# Patient Record
Sex: Female | Born: 1955 | Race: Black or African American | Hispanic: No | Marital: Single | Smoking: Current every day smoker
Health system: Southern US, Community
[De-identification: ages and names within clinical notes are randomized; demographics above are authoritative.]

## PROBLEM LIST (undated history)

## (undated) DIAGNOSIS — E119 Type 2 diabetes mellitus without complications: Secondary | ICD-10-CM

## (undated) DIAGNOSIS — M5430 Sciatica, unspecified side: Secondary | ICD-10-CM

## (undated) DIAGNOSIS — J45909 Unspecified asthma, uncomplicated: Secondary | ICD-10-CM

---

## 2004-05-28 ENCOUNTER — Emergency Department (HOSPITAL_COMMUNITY): Admission: EM | Admit: 2004-05-28 | Discharge: 2004-05-28 | Payer: Self-pay | Admitting: Emergency Medicine

## 2008-05-18 ENCOUNTER — Emergency Department (HOSPITAL_COMMUNITY): Admission: EM | Admit: 2008-05-18 | Discharge: 2008-05-18 | Payer: Self-pay | Admitting: Emergency Medicine

## 2017-06-11 ENCOUNTER — Other Ambulatory Visit: Payer: Self-pay

## 2017-06-11 ENCOUNTER — Emergency Department (HOSPITAL_COMMUNITY)
Admission: EM | Admit: 2017-06-11 | Discharge: 2017-06-11 | Disposition: A | Discharge status: Home / Self Care | Payer: Medicaid Other | Attending: Emergency Medicine | Admitting: Emergency Medicine

## 2017-06-11 ENCOUNTER — Encounter (HOSPITAL_COMMUNITY): Payer: Self-pay | Admitting: Emergency Medicine

## 2017-06-11 DIAGNOSIS — Z5321 Procedure and treatment not carried out due to patient leaving prior to being seen by health care provider: Secondary | ICD-10-CM | POA: Insufficient documentation

## 2017-06-11 DIAGNOSIS — R079 Chest pain, unspecified: Secondary | ICD-10-CM | POA: Insufficient documentation

## 2017-06-11 DIAGNOSIS — R109 Unspecified abdominal pain: Secondary | ICD-10-CM | POA: Insufficient documentation

## 2017-06-11 HISTORY — DX: Type 2 diabetes mellitus without complications: E11.9

## 2017-06-11 LAB — URINALYSIS, ROUTINE W REFLEX MICROSCOPIC
BILIRUBIN URINE: NEGATIVE
Glucose, UA: NEGATIVE mg/dL
Hgb urine dipstick: NEGATIVE
Ketones, ur: NEGATIVE mg/dL
Nitrite: NEGATIVE
Protein, ur: NEGATIVE mg/dL
SPECIFIC GRAVITY, URINE: 1.01 (ref 1.005–1.030)
pH: 6 (ref 5.0–8.0)

## 2017-06-11 LAB — URINALYSIS, MICROSCOPIC (REFLEX): RBC / HPF: NONE SEEN RBC/hpf (ref 0–5)

## 2017-06-11 LAB — CBC
HEMATOCRIT: 43.5 % (ref 36.0–46.0)
HEMOGLOBIN: 13.7 g/dL (ref 12.0–15.0)
MCH: 28.8 pg (ref 26.0–34.0)
MCHC: 31.5 g/dL (ref 30.0–36.0)
MCV: 91.4 fL (ref 78.0–100.0)
Platelets: 274 10*3/uL (ref 150–400)
RBC: 4.76 MIL/uL (ref 3.87–5.11)
RDW: 12.7 % (ref 11.5–15.5)
WBC: 10 10*3/uL (ref 4.0–10.5)

## 2017-06-11 LAB — LIPASE, BLOOD: Lipase: 29 U/L (ref 11–51)

## 2017-06-11 LAB — COMPREHENSIVE METABOLIC PANEL
ALBUMIN: 3.7 g/dL (ref 3.5–5.0)
ALT: 41 U/L (ref 14–54)
AST: 25 U/L (ref 15–41)
Alkaline Phosphatase: 93 U/L (ref 38–126)
Anion gap: 11 (ref 5–15)
BILIRUBIN TOTAL: 0.4 mg/dL (ref 0.3–1.2)
BUN: 12 mg/dL (ref 6–20)
CALCIUM: 9.8 mg/dL (ref 8.9–10.3)
CO2: 27 mmol/L (ref 22–32)
Chloride: 103 mmol/L (ref 101–111)
Creatinine, Ser: 0.82 mg/dL (ref 0.44–1.00)
GFR calc Af Amer: >60 mL/min (ref >60)
GFR calc non Af Amer: >60 mL/min (ref >60)
GLUCOSE: 122 mg/dL — AB (ref 65–99)
POTASSIUM: 4.1 mmol/L (ref 3.5–5.1)
SODIUM: 141 mmol/L (ref 135–145)
TOTAL PROTEIN: 7.7 g/dL (ref 6.5–8.1)

## 2017-06-11 NOTE — ED Notes (Signed)
Called multiple times by multiple RNs for room.   No response in waiting room.

## 2017-06-11 NOTE — ED Triage Notes (Signed)
Pt to ER for 3 days epigastric abd pain, generalized tenderness in all quadrants. Also reports chest pain and back pain x3 days. Also reports back, leg, shoulder, neck, and head pain. Pt poor historian. Reports nausea without vomiting and frequent bowel movements, "sometimes normal, sometimes not." pt a/o x4. Lung sounds clear.

## 2017-06-12 ENCOUNTER — Emergency Department (HOSPITAL_COMMUNITY)
Admission: EM | Admit: 2017-06-12 | Discharge: 2017-06-12 | Discharge status: Against Medical Advice | Payer: Medicaid Other | Attending: Emergency Medicine | Admitting: Emergency Medicine

## 2017-06-12 DIAGNOSIS — Z5321 Procedure and treatment not carried out due to patient leaving prior to being seen by health care provider: Secondary | ICD-10-CM | POA: Insufficient documentation

## 2017-06-12 DIAGNOSIS — R197 Diarrhea, unspecified: Secondary | ICD-10-CM | POA: Diagnosis present

## 2017-06-12 NOTE — ED Notes (Signed)
PT told EMT 1st that she is leaving due to wait time.,

## 2017-06-12 NOTE — ED Notes (Addendum)
Pt BIB [facility] c/o nausea and diarrhea intermittently x5 days. Also reports lowers central abdominal pain. VSS.

## 2017-08-04 ENCOUNTER — Telehealth: Payer: Self-pay | Admitting: Physician Assistant

## 2017-08-04 NOTE — Telephone Encounter (Signed)
Pt needs to make an appt for establishing care and to follow up on her psych meds per Collie SiadAngela Johnson FNP. From [facility]. Please call pt to schedule. [phone]

## 2017-08-04 NOTE — Telephone Encounter (Signed)
Not out patient and has medicaid which we are not contracted with.

## 2017-08-07 ENCOUNTER — Encounter (HOSPITAL_COMMUNITY): Payer: Self-pay

## 2017-08-07 ENCOUNTER — Other Ambulatory Visit: Payer: Self-pay

## 2017-08-07 ENCOUNTER — Emergency Department (HOSPITAL_COMMUNITY)
Admission: EM | Admit: 2017-08-07 | Discharge: 2017-08-07 | Disposition: A | Discharge status: Home / Self Care | Payer: Medicaid Other | Attending: Emergency Medicine | Admitting: Emergency Medicine

## 2017-08-07 ENCOUNTER — Emergency Department (HOSPITAL_COMMUNITY): Payer: Medicaid Other

## 2017-08-07 DIAGNOSIS — F17210 Nicotine dependence, cigarettes, uncomplicated: Secondary | ICD-10-CM | POA: Insufficient documentation

## 2017-08-07 DIAGNOSIS — Z79899 Other long term (current) drug therapy: Secondary | ICD-10-CM | POA: Diagnosis not present

## 2017-08-07 DIAGNOSIS — R0602 Shortness of breath: Secondary | ICD-10-CM | POA: Diagnosis not present

## 2017-08-07 DIAGNOSIS — M545 Low back pain, unspecified: Secondary | ICD-10-CM

## 2017-08-07 DIAGNOSIS — M25561 Pain in right knee: Secondary | ICD-10-CM | POA: Diagnosis not present

## 2017-08-07 DIAGNOSIS — J45909 Unspecified asthma, uncomplicated: Secondary | ICD-10-CM | POA: Diagnosis not present

## 2017-08-07 DIAGNOSIS — G8929 Other chronic pain: Secondary | ICD-10-CM | POA: Diagnosis not present

## 2017-08-07 DIAGNOSIS — M25562 Pain in left knee: Secondary | ICD-10-CM | POA: Diagnosis not present

## 2017-08-07 DIAGNOSIS — E119 Type 2 diabetes mellitus without complications: Secondary | ICD-10-CM | POA: Insufficient documentation

## 2017-08-07 HISTORY — DX: Unspecified asthma, uncomplicated: J45.909

## 2017-08-07 HISTORY — DX: Sciatica, unspecified side: M54.30

## 2017-08-07 LAB — CBC WITH DIFFERENTIAL/PLATELET
Abs Immature Granulocytes: 0 10*3/uL (ref 0.0–0.1)
BASOS PCT: 1 %
Basophils Absolute: 0.1 10*3/uL (ref 0.0–0.1)
EOS ABS: 0.2 10*3/uL (ref 0.0–0.7)
EOS PCT: 2 %
HCT: 42.8 % (ref 36.0–46.0)
Hemoglobin: 13.1 g/dL (ref 12.0–15.0)
IMMATURE GRANULOCYTES: 0 %
Lymphocytes Relative: 33 %
Lymphs Abs: 3.1 10*3/uL (ref 0.7–4.0)
MCH: 28.8 pg (ref 26.0–34.0)
MCHC: 30.6 g/dL (ref 30.0–36.0)
MCV: 94.1 fL (ref 78.0–100.0)
Monocytes Absolute: 0.8 10*3/uL (ref 0.1–1.0)
Monocytes Relative: 9 %
NEUTROS PCT: 55 %
Neutro Abs: 5.3 10*3/uL (ref 1.7–7.7)
PLATELETS: 239 10*3/uL (ref 150–400)
RBC: 4.55 MIL/uL (ref 3.87–5.11)
RDW: 13.4 % (ref 11.5–15.5)
WBC: 9.6 10*3/uL (ref 4.0–10.5)

## 2017-08-07 LAB — COMPREHENSIVE METABOLIC PANEL
ALK PHOS: 82 U/L (ref 38–126)
ALT: 24 U/L (ref 0–44)
AST: 20 U/L (ref 15–41)
Albumin: 3.5 g/dL (ref 3.5–5.0)
Anion gap: 8 (ref 5–15)
BUN: 17 mg/dL (ref 8–23)
CALCIUM: 9.2 mg/dL (ref 8.9–10.3)
CO2: 24 mmol/L (ref 22–32)
CREATININE: 1.23 mg/dL — AB (ref 0.44–1.00)
Chloride: 113 mmol/L — ABNORMAL HIGH (ref 98–111)
GFR, EST AFRICAN AMERICAN: 53 mL/min — AB (ref >60)
GFR, EST NON AFRICAN AMERICAN: 46 mL/min — AB (ref >60)
Glucose, Bld: 118 mg/dL — ABNORMAL HIGH (ref 70–99)
Potassium: 4 mmol/L (ref 3.5–5.1)
Sodium: 145 mmol/L (ref 135–145)
Total Bilirubin: 0.6 mg/dL (ref 0.3–1.2)
Total Protein: 6.6 g/dL (ref 6.5–8.1)

## 2017-08-07 MED ORDER — TRAMADOL HCL 50 MG PO TABS: 50.0000 mg | ORAL_TABLET | Freq: Four times a day (QID) | ORAL | 0 refills | Status: AC | PRN

## 2017-08-07 NOTE — Discharge Instructions (Addendum)
Please return for any problem.  Follow-up with your regular doctor as instructed. °

## 2017-08-07 NOTE — ED Triage Notes (Signed)
Pt brought in by [facility] from home for SOB and worsening sciatica pain. Pt has hx of asthma, did not take home meds this am. Pt given x2 duoneb PTA. Pt discharged from [facility] yesterday after being seen for hypertension. Pt A+Ox4 and in NAD on arrival.

## 2017-08-07 NOTE — ED Notes (Signed)
Patient transported to X-ray 

## 2017-08-07 NOTE — ED Notes (Signed)
Patient verbalizes understanding of discharge instructions. Opportunity for questioning and answers were provided. Pt given bus pass and discharged from ED.

## 2017-08-07 NOTE — ED Provider Notes (Signed)
[facility] EMERGENCY DEPARTMENT Provider Note   CSN: [id number] Arrival date & time: 08/07/17  0841     History   Chief Complaint Chief Complaint  Patient presents with  . Shortness of Breath    HPI Debra Hull is a 62 y.o. female.  62 year old female with prior medical history as detailed below presents with complaint of low back pain.  Patient reports long-standing history of low back pain.  Patient reports that her gabapentin that she takes regularly is not adequate for pain control.  She denies any specific injury.  She denies recent fever.  She denies focal weakness.  She was transported here by EMS.  EMS noticed some faint wheezes on their initial evaluation and gave the patient 1 DuoNeb treatment.  Her wheezing is resolved by time of arrival to the ED.  She is without complaint of chest pain or shortness of breath.  The history is provided by the patient.  Back Pain   This is a chronic problem. The current episode started more than 1 week ago. The problem occurs daily. The problem has not changed since onset.The pain is associated with no known injury. The pain is present in the lumbar spine. The quality of the pain is described as aching. The pain does not radiate. The pain is mild. The symptoms are aggravated by bending and twisting. The pain is worse during the night. Pertinent negatives include no chest pain, no fever, no abdominal pain, no bowel incontinence, no perianal numbness, no bladder incontinence, no dysuria, no paresthesias, no paresis, no tingling and no weakness.    Past Medical History:  Diagnosis Date  . Asthma   . Diabetes mellitus without complication (HCC)   . Sciatica     There are no active problems to display for this patient.   History reviewed. No pertinent surgical history.   OB History   None      Home Medications    Prior to Admission medications   Medication Sig Start Date End Date Taking? Authorizing Provider   albuterol (PROVENTIL HFA;VENTOLIN HFA) 108 (90 Base) MCG/ACT inhaler Inhale 1 puff into the lungs every 6 (six) hours as needed for wheezing or shortness of breath.    [provider]  ATORVASTATIN CALCIUM PO Take 1 tablet by mouth daily.    [provider]  CARVEDILOL PO Take 1 tablet by mouth 2 (two) times daily.    [provider]  Fluticasone-Salmeterol (ADVAIR DISKUS IN) Inhale into the lungs.    [provider]  GABAPENTIN PO Take 2 tablets by mouth 3 (three) times daily.    [provider]  HYDROcodone-Acetaminophen (VICODIN PO) Take 1 tablet by mouth as needed (pain).    [provider]  METFORMIN HCL PO Take 1 tablet by mouth 2 (two) times daily.    [provider]  traMADol (ULTRAM) 50 MG tablet Take 1 tablet (50 mg total) by mouth every 6 (six) hours as needed. 08/07/17   Wynetta Fines, MD    Family History No family history on file.  Social History Social History   Tobacco Use  . Smoking status: Current Every Day Smoker    Packs/day: 1.00    Types: Cigarettes  . Smokeless tobacco: Never Used  Substance Use Topics  . Alcohol use: Not on file  . Drug use: Not on file     Allergies   Patient has no known allergies.   Review of Systems Review of Systems  Constitutional: Negative for fever.  Cardiovascular: Negative for chest pain.  Gastrointestinal: Negative for abdominal pain and bowel incontinence.  Genitourinary: Negative for bladder incontinence and dysuria.  Musculoskeletal: Positive for back pain.  Neurological: Negative for tingling, weakness and paresthesias.  All other systems reviewed and are negative.    Physical Exam Updated Vital Signs BP 105/71   Pulse 78   Resp 16   Ht 4\' 11"  (1.499 m)   Wt 72.6 kg (160 lb)   SpO2 96%   BMI 32.32 kg/m   Physical Exam  Constitutional: She is oriented to person, place, and time. She appears well-developed and well-nourished. No distress.    HENT:  Head: Normocephalic and atraumatic.  Mouth/Throat: Oropharynx is clear and moist.  Eyes: Pupils are equal, round, and reactive to light. Conjunctivae and EOM are normal.  Neck: Normal range of motion. Neck supple.  Cardiovascular: Normal rate, regular rhythm and normal heart sounds.  Pulmonary/Chest: Effort normal and breath sounds normal. No respiratory distress.  Abdominal: Soft. She exhibits no distension. There is no tenderness.  Musculoskeletal: Normal range of motion. She exhibits no edema or deformity.  Neurological: She is alert and oriented to person, place, and time.  Skin: Skin is warm and dry.  Psychiatric: She has a normal mood and affect.  Nursing note and vitals reviewed.    ED Treatments / Results  Labs (all labs ordered are listed, but only abnormal results are displayed) Labs Reviewed  COMPREHENSIVE METABOLIC PANEL - Abnormal; Notable for the following components:      Result Value   Chloride 113 (*)    Glucose, Bld 118 (*)    Creatinine, Ser 1.23 (*)    GFR calc non Af Amer 46 (*)    GFR calc Af Amer 53 (*)    All other components within normal limits  CBC WITH DIFFERENTIAL/PLATELET    EKG EKG Interpretation  Date/Time:  Friday August 07 2017 09:31:28 EDT Ventricular Rate:  90 PR Interval:    QRS Duration: 86 QT Interval:  399 QTC Calculation: 489 R Axis:   77 Text Interpretation:  Sinus rhythm Borderline prolonged QT interval Confirmed by Kristine RoyalMessick, Peter [phone]([id number]) on 08/07/2017 9:34:30 AM   Radiology Dg Chest 2 View  Result Date: 08/07/2017 CLINICAL DATA:  Shortness of breath today. EXAM: CHEST - 2 VIEW COMPARISON:  Chest x-ray dated 05/18/2008. FINDINGS: The heart size and mediastinal contours are within normal limits. Both lungs are clear. The visualized skeletal structures are unremarkable. IMPRESSION: No active cardiopulmonary disease. No evidence of pneumonia or pulmonary edema. Electronically Signed   By: Bary RichardStan  Maynard M.D.   On: 08/07/2017  09:25   Dg Lumbar Spine Complete  Result Date: 08/07/2017 CLINICAL DATA:  Chronic low back pain.  No known injury. EXAM: LUMBAR SPINE - COMPLETE 4+ VIEW COMPARISON:  None. FINDINGS: There is no evidence of lumbar spine fracture. Alignment is normal. Intervertebral disc spaces are maintained. Mild degenerative facet hypertrophy in the lower lumbar spine. Cholecystectomy clips in the RIGHT upper quadrant. Atherosclerotic changes of the infrarenal abdominal aorta. Visualized paravertebral soft tissues are otherwise unremarkable. IMPRESSION: 1. No acute findings. 2. Mild degenerative facet hypertrophy in the lower lumbar spine. Disc spaces are well maintained throughout the lumbar spine. 3. Aortic atherosclerosis. Electronically Signed   By: Bary RichardStan  Maynard M.D.   On: 08/07/2017 09:27   Dg Knee 2 Views Left  Result Date: 08/07/2017 CLINICAL DATA:  Bilateral knee pain for weeks.  No injury. EXAM: LEFT KNEE - 1-2 VIEW  COMPARISON:  None. FINDINGS: No evidence of fracture, dislocation, or joint effusion. No evidence of arthropathy or other focal bone abnormality. Soft tissues are unremarkable. IMPRESSION: Negative. Electronically Signed   By: Bary Richard M.D.   On: 08/07/2017 09:28   Dg Knee 2 Views Right  Result Date: 08/07/2017 CLINICAL DATA:  Bilateral knee pain for weeks.  No known injury. EXAM: RIGHT KNEE - 1-2 VIEW COMPARISON:  None. FINDINGS: No evidence of fracture, dislocation, or joint effusion. No evidence of arthropathy or other focal bone abnormality. Soft tissues are unremarkable. IMPRESSION: Negative. Electronically Signed   By: Bary Richard M.D.   On: 08/07/2017 09:28    Procedures Procedures (including critical care time)  Medications Ordered in ED Medications - No data to display   Initial Impression / Assessment and Plan / ED Course  I have reviewed the triage vital signs and the nursing notes.  Pertinent labs & imaging results that were available during my care of the patient  were reviewed by me and considered in my medical decision making (see chart for details).     MDM  Screen complete  Patient is presenting primarily for evaluation of low back pain.  This appears to be a chronic issue.  Exam does not suggest other acute pathology.  Screening films are without significant pathology.  Screening labs are also without significant abnormality.  Patient feels improved following her ED evaluation.  She desires discharge home.  Importance of close follow-up is stressed.  Strict return precautions are given and understood.  Final Clinical Impressions(s) / ED Diagnoses   Final diagnoses:  Chronic bilateral low back pain without sciatica    ED Discharge Orders        Ordered    traMADol (ULTRAM) 50 MG tablet  Every 6 hours PRN     08/07/17 1007       Wynetta Fines, MD 08/07/17 1011

## 2017-08-08 ENCOUNTER — Emergency Department (HOSPITAL_COMMUNITY)
Admission: EM | Admit: 2017-08-08 | Discharge: 2017-08-08 | Disposition: A | Discharge status: Home / Self Care | Payer: Medicaid Other | Attending: Emergency Medicine | Admitting: Emergency Medicine

## 2017-08-08 ENCOUNTER — Encounter (HOSPITAL_COMMUNITY): Payer: Self-pay

## 2017-08-08 DIAGNOSIS — E119 Type 2 diabetes mellitus without complications: Secondary | ICD-10-CM | POA: Diagnosis not present

## 2017-08-08 DIAGNOSIS — M545 Low back pain, unspecified: Secondary | ICD-10-CM

## 2017-08-08 DIAGNOSIS — G8929 Other chronic pain: Secondary | ICD-10-CM | POA: Diagnosis not present

## 2017-08-08 DIAGNOSIS — R202 Paresthesia of skin: Secondary | ICD-10-CM | POA: Insufficient documentation

## 2017-08-08 DIAGNOSIS — J45909 Unspecified asthma, uncomplicated: Secondary | ICD-10-CM | POA: Insufficient documentation

## 2017-08-08 DIAGNOSIS — Z7984 Long term (current) use of oral hypoglycemic drugs: Secondary | ICD-10-CM | POA: Insufficient documentation

## 2017-08-08 DIAGNOSIS — R062 Wheezing: Secondary | ICD-10-CM | POA: Insufficient documentation

## 2017-08-08 DIAGNOSIS — Z5320 Procedure and treatment not carried out because of patient's decision for unspecified reasons: Secondary | ICD-10-CM | POA: Diagnosis not present

## 2017-08-08 DIAGNOSIS — Z79899 Other long term (current) drug therapy: Secondary | ICD-10-CM | POA: Insufficient documentation

## 2017-08-08 DIAGNOSIS — F17210 Nicotine dependence, cigarettes, uncomplicated: Secondary | ICD-10-CM | POA: Diagnosis not present

## 2017-08-08 MED ORDER — ACETAMINOPHEN 500 MG PO TABS: 1000.0000 mg | ORAL_TABLET | Freq: Once | ORAL | Status: DC

## 2017-08-08 MED ORDER — PREDNISONE 20 MG PO TABS
60.0000 mg | ORAL_TABLET | Freq: Once | ORAL | Status: DC
Start: 2017-08-08 — End: 2017-08-08

## 2017-08-08 MED ORDER — LIDOCAINE 5 % EX PTCH: 1.0000 | MEDICATED_PATCH | CUTANEOUS | Status: DC

## 2017-08-08 NOTE — ED Provider Notes (Signed)
[facility] EMERGENCY DEPARTMENT Provider Note   CSN: [id number] Arrival date & time: 08/08/17  1247     History   Chief Complaint Chief Complaint  Patient presents with  . Shortness of Breath    HPI Debra Hull is a 62 y.o. female with history of asthma and sciatica is here for evaluation of "sciatica flare".  She reports 10/10 pain to her right low back that radiates into her right buttocks and shoots down her posterior right leg including toes.  Associated with intermittent tingling to her second right toe.  States she has a long history of sciatica and her pain today feels typical of her sciatica flares.  She denies any falls or recent trauma.  She denies any urinary symptoms, groin numbness, bladder or bowel incontinence or retention, loss of sensation or strength in her lower extremities, fevers, chills, abdominal pain.  She was seen in the ER yesterday for back pain, she was discharged with tramadol but states that "tramadol does not work".  She does not take Tylenol or ibuprofen because these medicines typically do not help with her sciatica pain.  She is now longer taking gabapentin because it does not help her sciatica pain.  I asked her about the shortness of breath documented on triage note, she states that she is not having any shortness of breath today.  Chart shows that she was noted to have some wheezing by EMS yesterday, that improved upon evaluation by EDP.  HPI  Past Medical History:  Diagnosis Date  . Asthma   . Diabetes mellitus without complication (HCC)   . Sciatica     There are no active problems to display for this patient.   History reviewed. No pertinent surgical history.   OB History   None      Home Medications    Prior to Admission medications   Medication Sig Start Date End Date Taking? Authorizing Provider  albuterol (PROVENTIL HFA;VENTOLIN HFA) 108 (90 Base) MCG/ACT inhaler Inhale 1 puff into the lungs every 6 (six)  hours as needed for wheezing or shortness of breath.    [provider]  ATORVASTATIN CALCIUM PO Take 1 tablet by mouth daily.    [provider]  CARVEDILOL PO Take 1 tablet by mouth 2 (two) times daily.    [provider]  Fluticasone-Salmeterol (ADVAIR DISKUS IN) Inhale into the lungs.    [provider]  GABAPENTIN PO Take 2 tablets by mouth 3 (three) times daily.    [provider]  HYDROcodone-Acetaminophen (VICODIN PO) Take 1 tablet by mouth as needed (pain).    [provider]  METFORMIN HCL PO Take 1 tablet by mouth 2 (two) times daily.    [provider]  traMADol (ULTRAM) 50 MG tablet Take 1 tablet (50 mg total) by mouth every 6 (six) hours as needed. 08/07/17   Wynetta Fines, MD    Family History History reviewed. No pertinent family history.  Social History Social History   Tobacco Use  . Smoking status: Current Every Day Smoker    Packs/day: 1.00    Types: Cigarettes  . Smokeless tobacco: Never Used  Substance Use Topics  . Alcohol use: Not on file  . Drug use: Not on file     Allergies   Patient has no known allergies.   Review of Systems Review of Systems  Musculoskeletal: Positive for back pain.  Neurological:       Tingling   All other  systems reviewed and are negative.    Physical Exam Updated Vital Signs Ht 4\' 11"  (1.499 m)   Wt 72.6 kg (160 lb)   SpO2 94%   BMI 32.32 kg/m   Physical Exam  Constitutional: She is oriented to person, place, and time. She appears well-developed and well-nourished. No distress.  HENT:  Head: Normocephalic and atraumatic.  Nose: Nose normal.  Eyes: EOM are normal.  Cardiovascular: Normal rate, S1 normal, S2 normal and normal heart sounds.  Pulses:      Radial pulses are 2+ on the right side, and 2+ on the left side.       Dorsalis pedis pulses are 2+ on the right side, and 2+ on the left side.  Pulmonary/Chest: Effort normal and breath sounds  normal. She has no decreased breath sounds.  Abdominal: Soft. Normal appearance and bowel sounds are normal. There is no tenderness.  No suprapubic or CVA tenderness   Musculoskeletal: She exhibits tenderness.       Right shoulder: She exhibits tenderness.       Lumbar back: She exhibits tenderness and pain.  C-spine: No midline tenderness  T-spine: No midline tenderness L-spine: No midline tenderness.  Mild right-sided SI and sciatic notch tenderness.  Full passive range of motion's of bilateral hips without pain.  Negative SLR bilaterally. Pelvis: No AP/lateral instability with compression.  No leg shortening or rotation.  Neurological: She is alert and oriented to person, place, and time.  5/5 strength with flexion/extension of hip, knee and ankle, bilaterally.  Sensation to light touch intact in lower extremities including feet  Skin: Skin is warm and dry. Capillary refill takes less than 2 seconds.  No overlying rash, ecchymosis or wounds to the lumbar spine or buttocks.  Psychiatric: She has a normal mood and affect. Her speech is normal and behavior is normal. Judgment and thought content normal. Cognition and memory are normal.  Nursing note and vitals reviewed.    ED Treatments / Results  Labs (all labs ordered are listed, but only abnormal results are displayed) Labs Reviewed - No data to display  EKG None  Radiology Dg Chest 2 View  Result Date: 08/07/2017 CLINICAL DATA:  Shortness of breath today. EXAM: CHEST - 2 VIEW COMPARISON:  Chest x-ray dated 05/18/2008. FINDINGS: The heart size and mediastinal contours are within normal limits. Both lungs are clear. The visualized skeletal structures are unremarkable. IMPRESSION: No active cardiopulmonary disease. No evidence of pneumonia or pulmonary edema. Electronically Signed   By: Bary Richard M.D.   On: 08/07/2017 09:25   Dg Lumbar Spine Complete  Result Date: 08/07/2017 CLINICAL DATA:  Chronic low back pain.  No known  injury. EXAM: LUMBAR SPINE - COMPLETE 4+ VIEW COMPARISON:  None. FINDINGS: There is no evidence of lumbar spine fracture. Alignment is normal. Intervertebral disc spaces are maintained. Mild degenerative facet hypertrophy in the lower lumbar spine. Cholecystectomy clips in the RIGHT upper quadrant. Atherosclerotic changes of the infrarenal abdominal aorta. Visualized paravertebral soft tissues are otherwise unremarkable. IMPRESSION: 1. No acute findings. 2. Mild degenerative facet hypertrophy in the lower lumbar spine. Disc spaces are well maintained throughout the lumbar spine. 3. Aortic atherosclerosis. Electronically Signed   By: Bary Richard M.D.   On: 08/07/2017 09:27   Dg Knee 2 Views Left  Result Date: 08/07/2017 CLINICAL DATA:  Bilateral knee pain for weeks.  No injury. EXAM: LEFT KNEE - 1-2 VIEW COMPARISON:  None. FINDINGS: No evidence of fracture, dislocation, or joint effusion. No evidence  of arthropathy or other focal bone abnormality. Soft tissues are unremarkable. IMPRESSION: Negative. Electronically Signed   By: Bary RichardStan  Maynard M.D.   On: 08/07/2017 09:28   Dg Knee 2 Views Right  Result Date: 08/07/2017 CLINICAL DATA:  Bilateral knee pain for weeks.  No known injury. EXAM: RIGHT KNEE - 1-2 VIEW COMPARISON:  None. FINDINGS: No evidence of fracture, dislocation, or joint effusion. No evidence of arthropathy or other focal bone abnormality. Soft tissues are unremarkable. IMPRESSION: Negative. Electronically Signed   By: Bary RichardStan  Maynard M.D.   On: 08/07/2017 09:28    Procedures Procedures (including critical care time)  Medications Ordered in ED Medications  lidocaine (LIDODERM) 5 % 1 patch (has no administration in time range)  acetaminophen (TYLENOL) tablet 1,000 mg (has no administration in time range)  predniSONE (DELTASONE) tablet 60 mg (has no administration in time range)     Initial Impression / Assessment and Plan / ED Course  I have reviewed the triage vital signs and the  nursing notes.  Pertinent labs & imaging results that were available during my care of the patient were reviewed by me and considered in my medical decision making (see chart for details).    Exam consistent with MSk back pain.  She has reproducible R SI and sciatic notch pain, negative SLR bilaterally. No overlaying rash. Abdominal exam benign, without pulsatility, suprapubic or CVA tenderness. Distal pulses symmetric bilaterally. No focal neurological deficits. Considered UTI/pyelo, kidney stone, cauda equina, epidural abscess, dissection considered but these don't fit clinical picture. Favoring strain vs spasm vs radiculopathy.  No red flag features of back pain present such as saddle anesthesia, bladder/bowel incontinence or retention, fevers, h/o cancer, IVDU, preceding trauma or falls, unilateral weakness, urinary symptoms. Emergent lab/imaging not thought to be indicated today as physical exam reassuring.  I reviewed lumbar x-rays obtained in ER yesterday which were unremarkable. No indication for emergent MRI. She can move and reposition herself on bed using leg without difficulty, only mild pain on back during movement.  Pt states ibuprofen, tylenol, tramadol, gabapentin do not help her pain.  Will offer lidoderm patch, toradol PO, prednisone for possible radiculopathy.  Pt was content with this plan and appreciative, states in the past patches have helped.  However, upon re-evaluation pt was no in room, no belonging found.   Final Clinical Impressions(s) / ED Diagnoses   Final diagnoses:  Chronic right-sided low back pain without sciatica    ED Discharge Orders    None       Jerrell MylarGibbons, Claudia J, PA-C 08/08/17 1622    Shaune PollackIsaacs, Cameron, MD 08/09/17 1213

## 2017-08-08 NOTE — ED Triage Notes (Signed)
Pt arrived via [facility] from [facility] c/o SOB.  Pt was seen yesterday for COPD exacerbation.  EMS gave 5mg  Albuterol.

## 2017-08-08 NOTE — ED Notes (Signed)
Pt not in hallway stretcher also checked bathroom, no personal belongings left on stretcher

## 2018-02-17 ENCOUNTER — Ambulatory Visit (HOSPITAL_COMMUNITY)
Admission: RE | Admit: 2018-02-17 | Discharge: 2018-02-17 | Disposition: A | Discharge status: Home / Self Care | Payer: Medicaid Other | Source: Ambulatory Visit | Attending: Nurse Practitioner | Admitting: Nurse Practitioner

## 2018-02-17 ENCOUNTER — Other Ambulatory Visit (HOSPITAL_COMMUNITY): Payer: Self-pay | Admitting: Nurse Practitioner

## 2018-02-17 DIAGNOSIS — R52 Pain, unspecified: Secondary | ICD-10-CM | POA: Insufficient documentation

## 2019-08-25 IMAGING — DX DG CHEST 2V
2 series · 2 of 2 positions shown · non-contrast
Comparison: Chest x-ray dated 05/18/2008.

CLINICAL DATA: Shortness of breath today.

EXAM:
CHEST - 2 VIEW

[x chest ap]
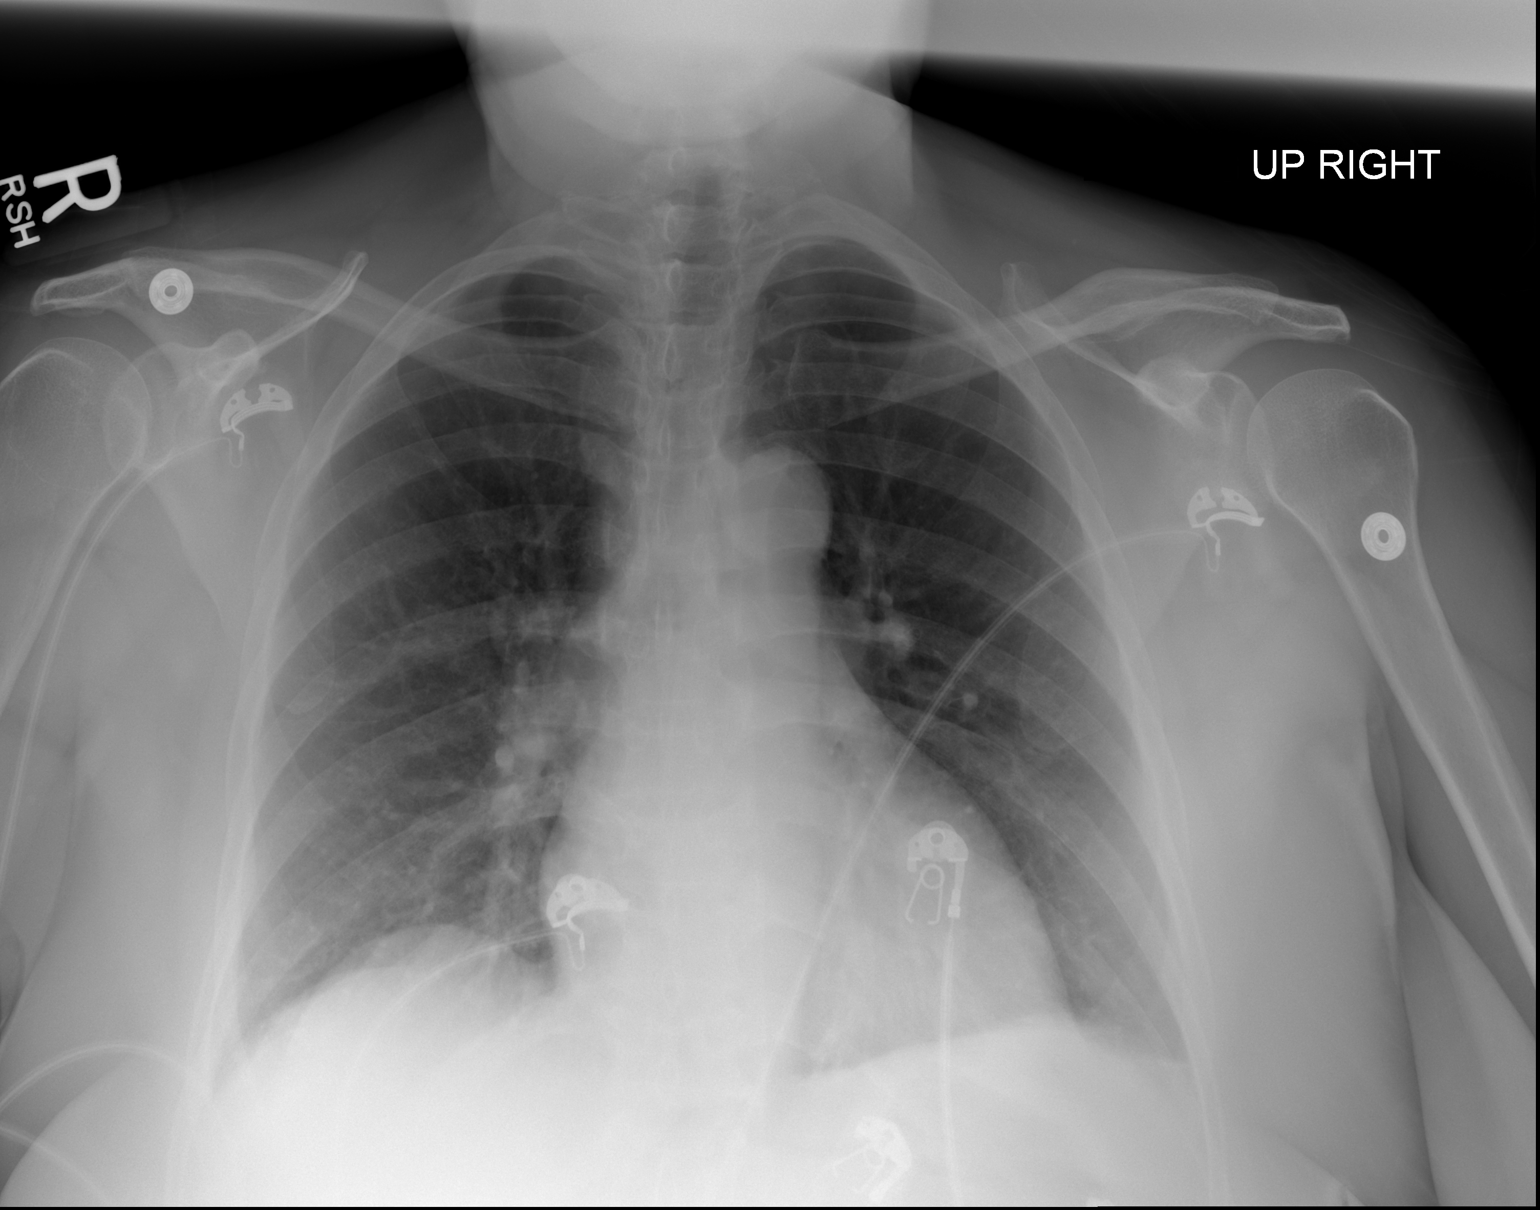

[w chest lat]
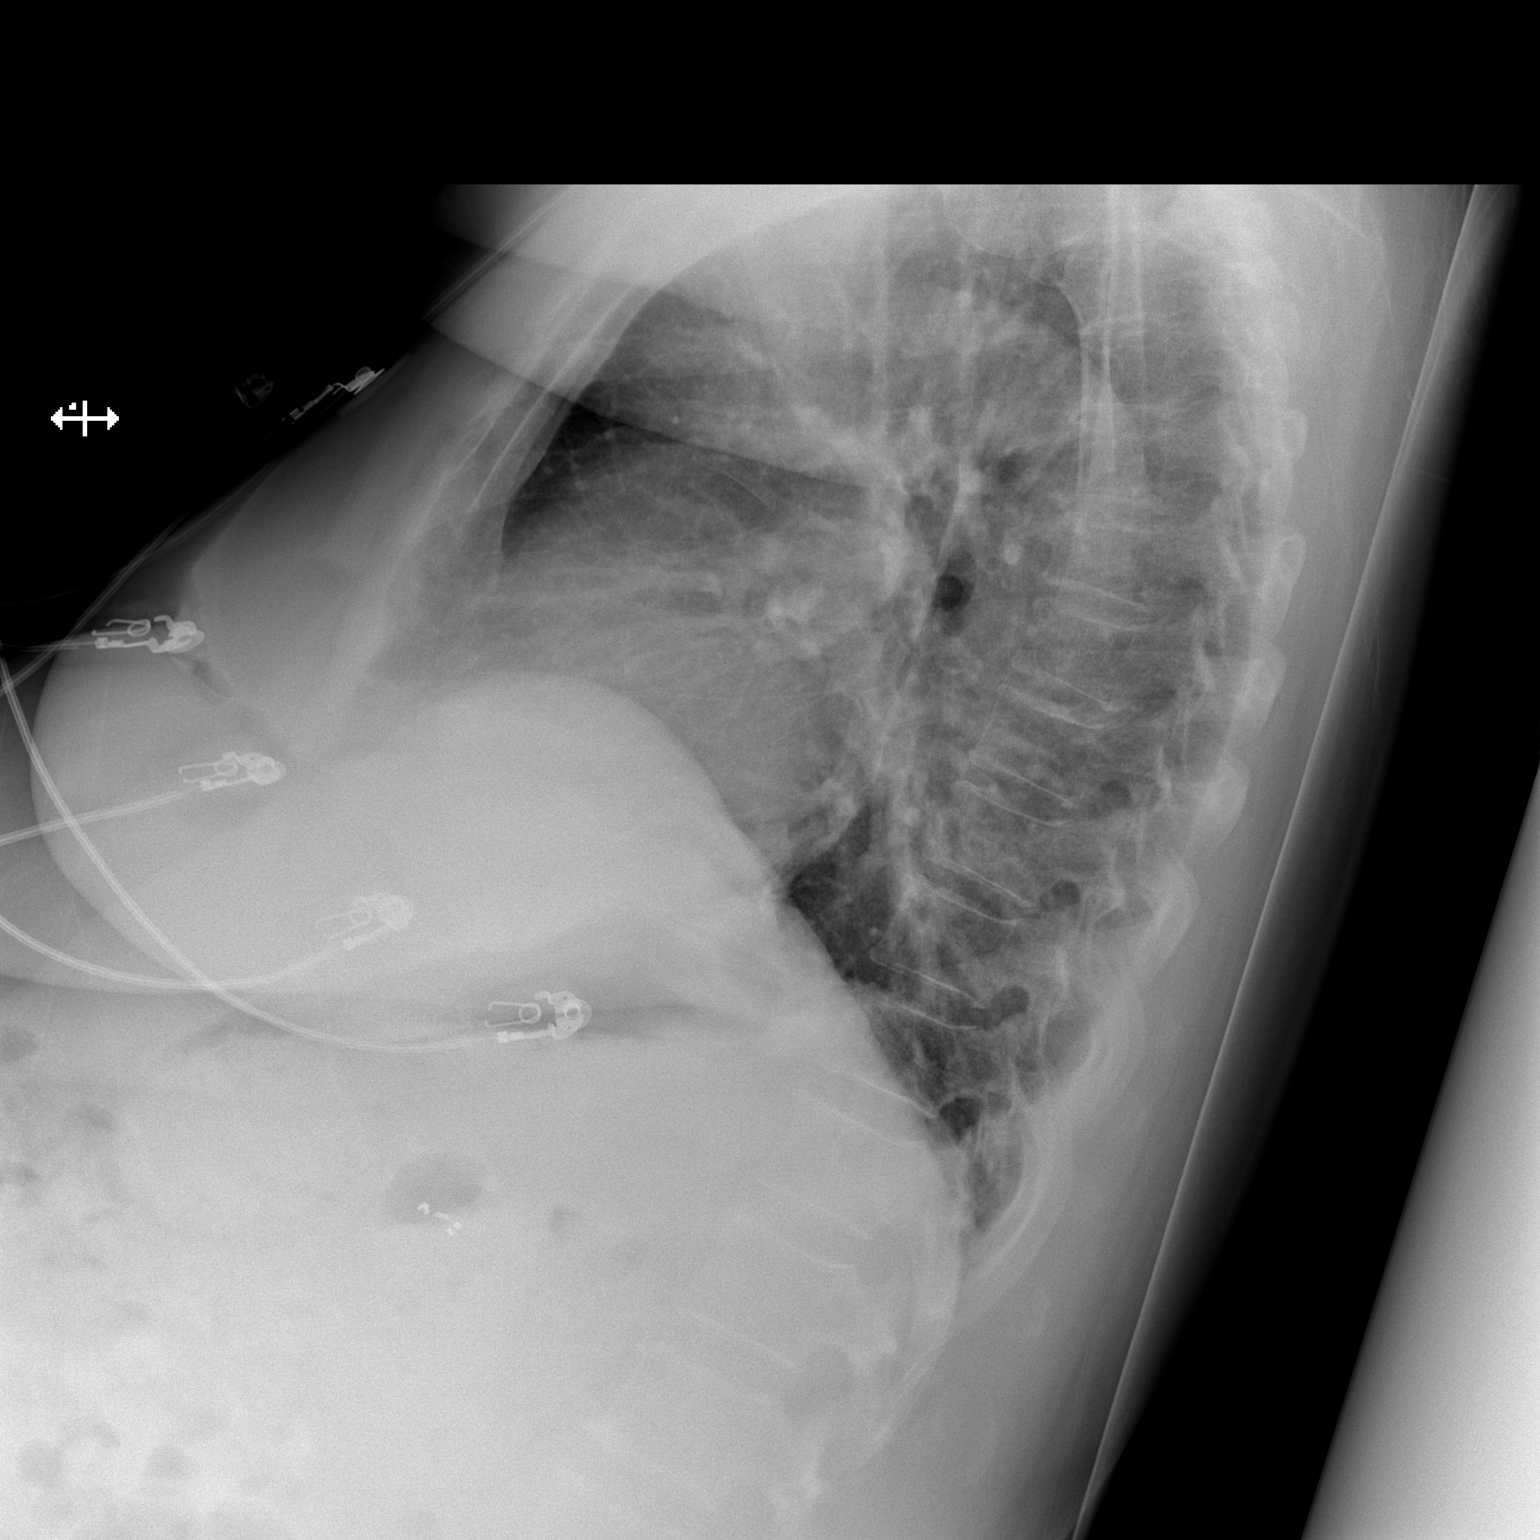

[2 of 2 positions shown; findings below may reference images not displayed]

FINDINGS: The heart size and mediastinal contours are within normal limits.
Both lungs are clear. The visualized skeletal structures are
unremarkable.
IMPRESSION: No active cardiopulmonary disease. No evidence of pneumonia or
pulmonary edema.

## 2019-08-25 IMAGING — DX DG KNEE 1-2V*L*
2 series · 2 of 2 positions shown · non-contrast
Comparison: None.

CLINICAL DATA: Bilateral knee pain for weeks.  No injury.

EXAM:
LEFT KNEE - 1-2 VIEW

[t knee lat left]
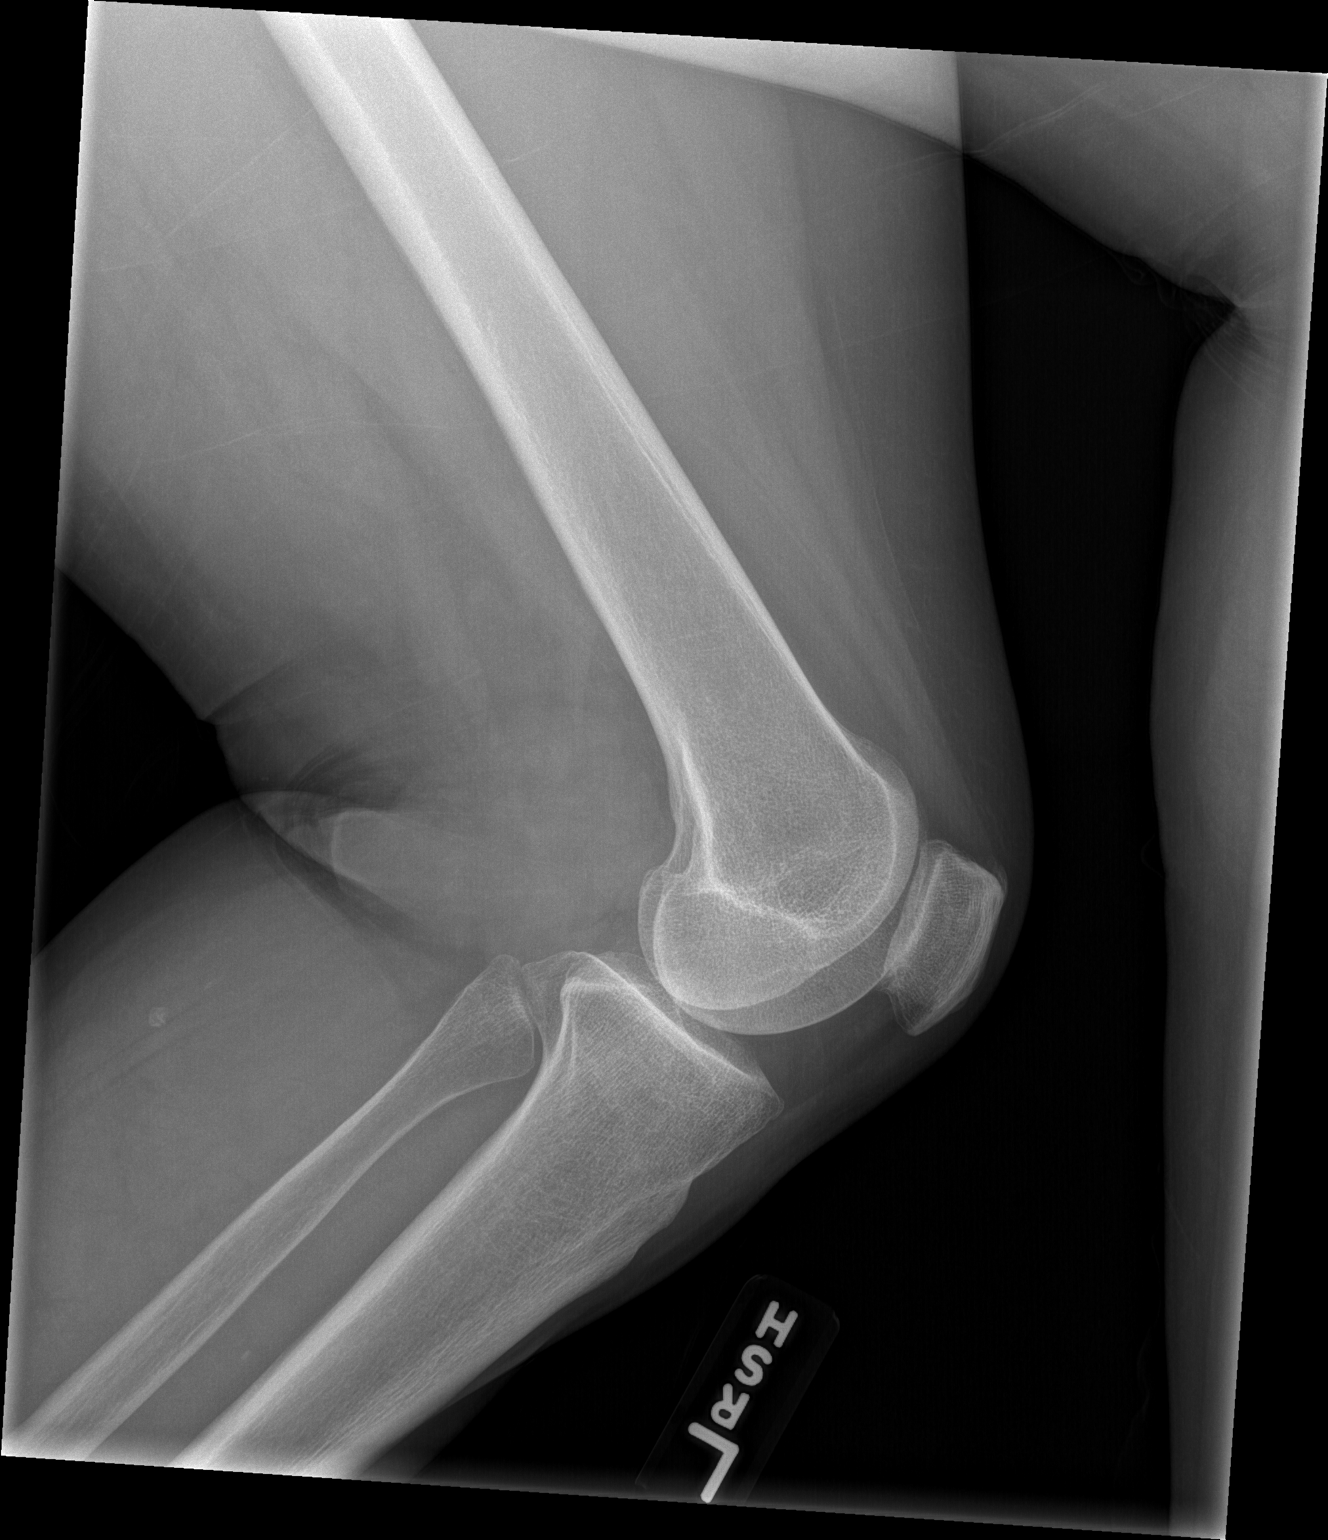

[t knee ap left]
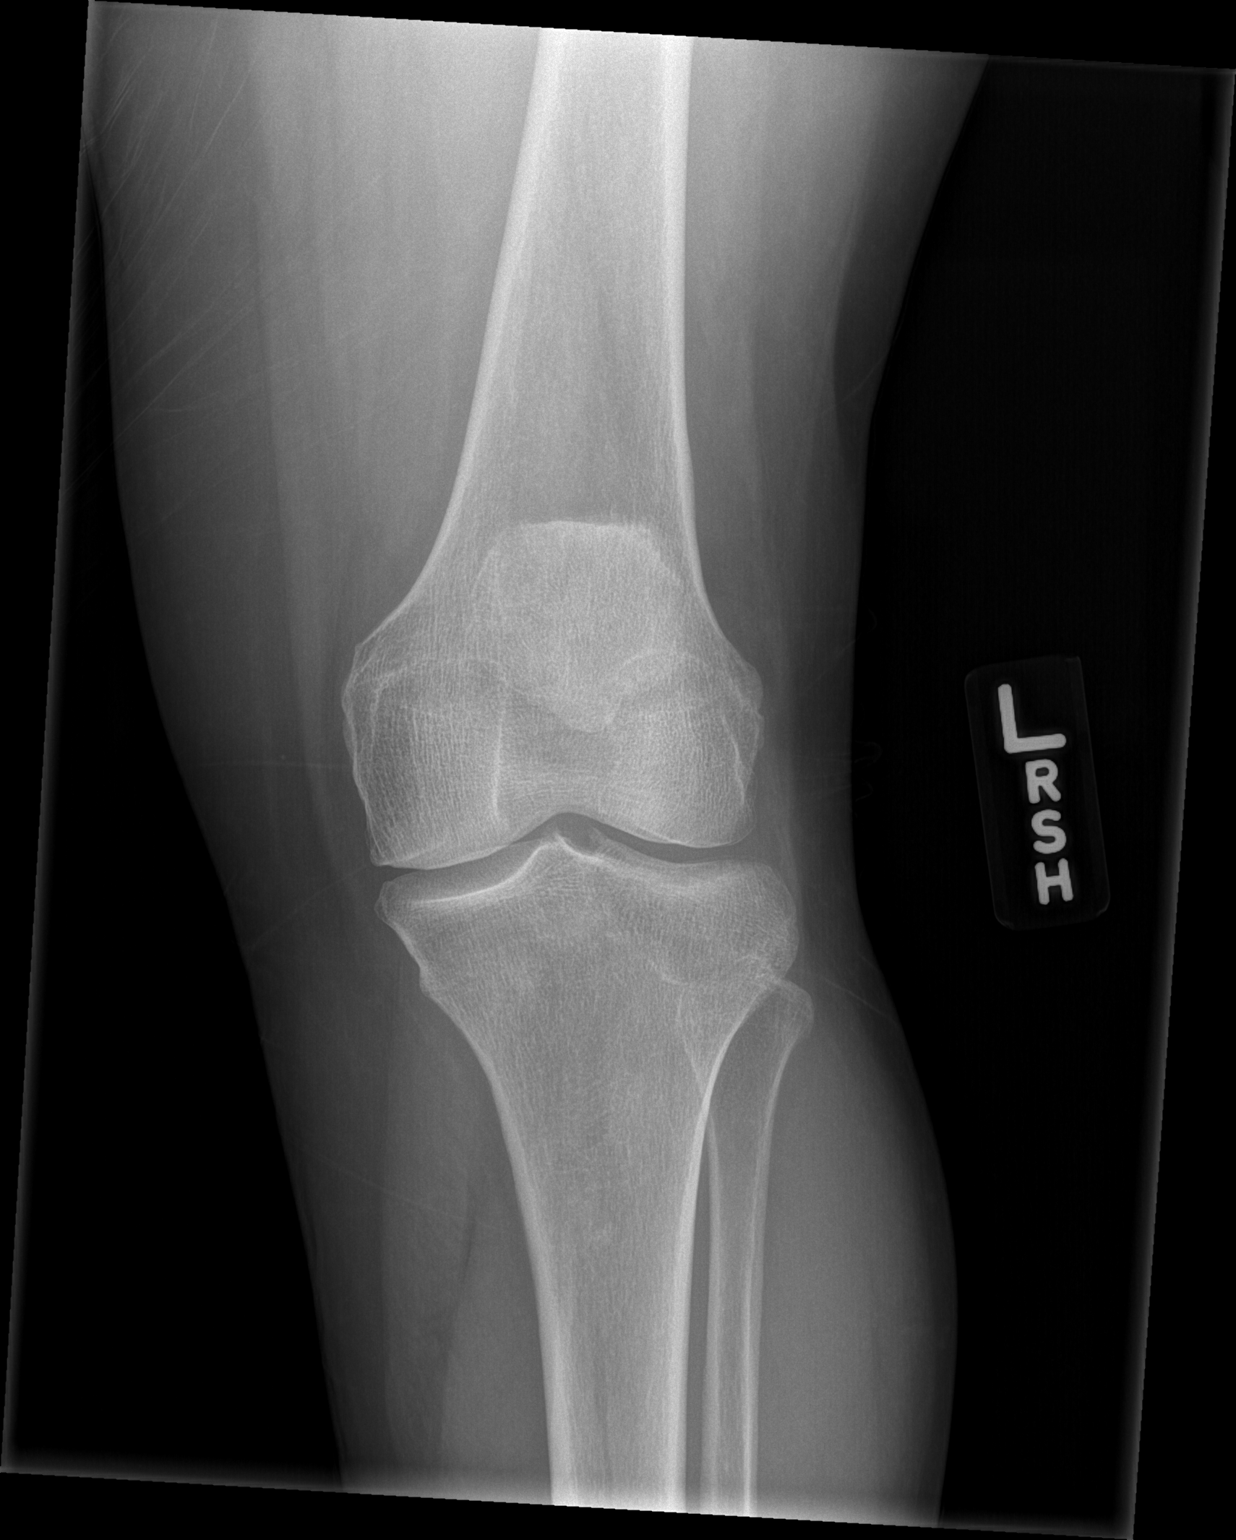

[2 of 2 positions shown; findings below may reference images not displayed]

FINDINGS: No evidence of fracture, dislocation, or joint effusion. No evidence
of arthropathy or other focal bone abnormality. Soft tissues are
unremarkable.
IMPRESSION: Negative.

## 2019-08-25 IMAGING — DX DG KNEE 1-2V*R*
2 series · 2 of 2 positions shown · non-contrast
Comparison: None.

CLINICAL DATA: Bilateral knee pain for weeks.  No known injury.

EXAM:
RIGHT KNEE - 1-2 VIEW

[t knee ap right]
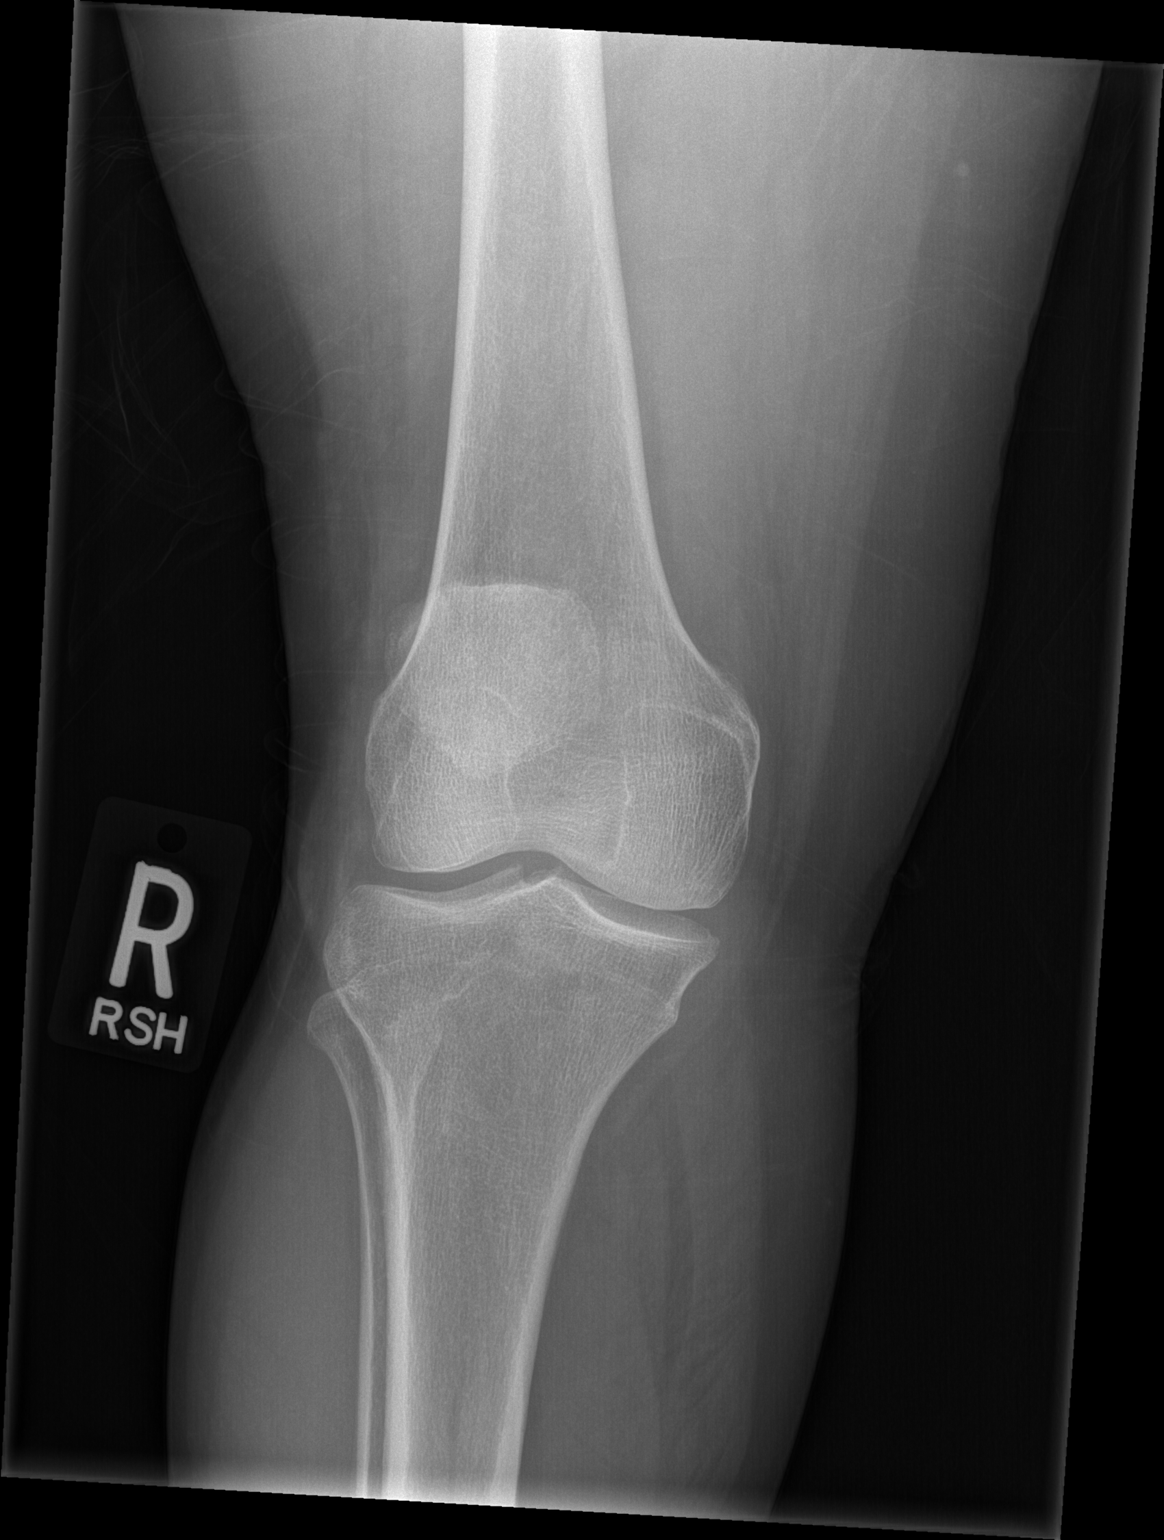

[t knee lat right]
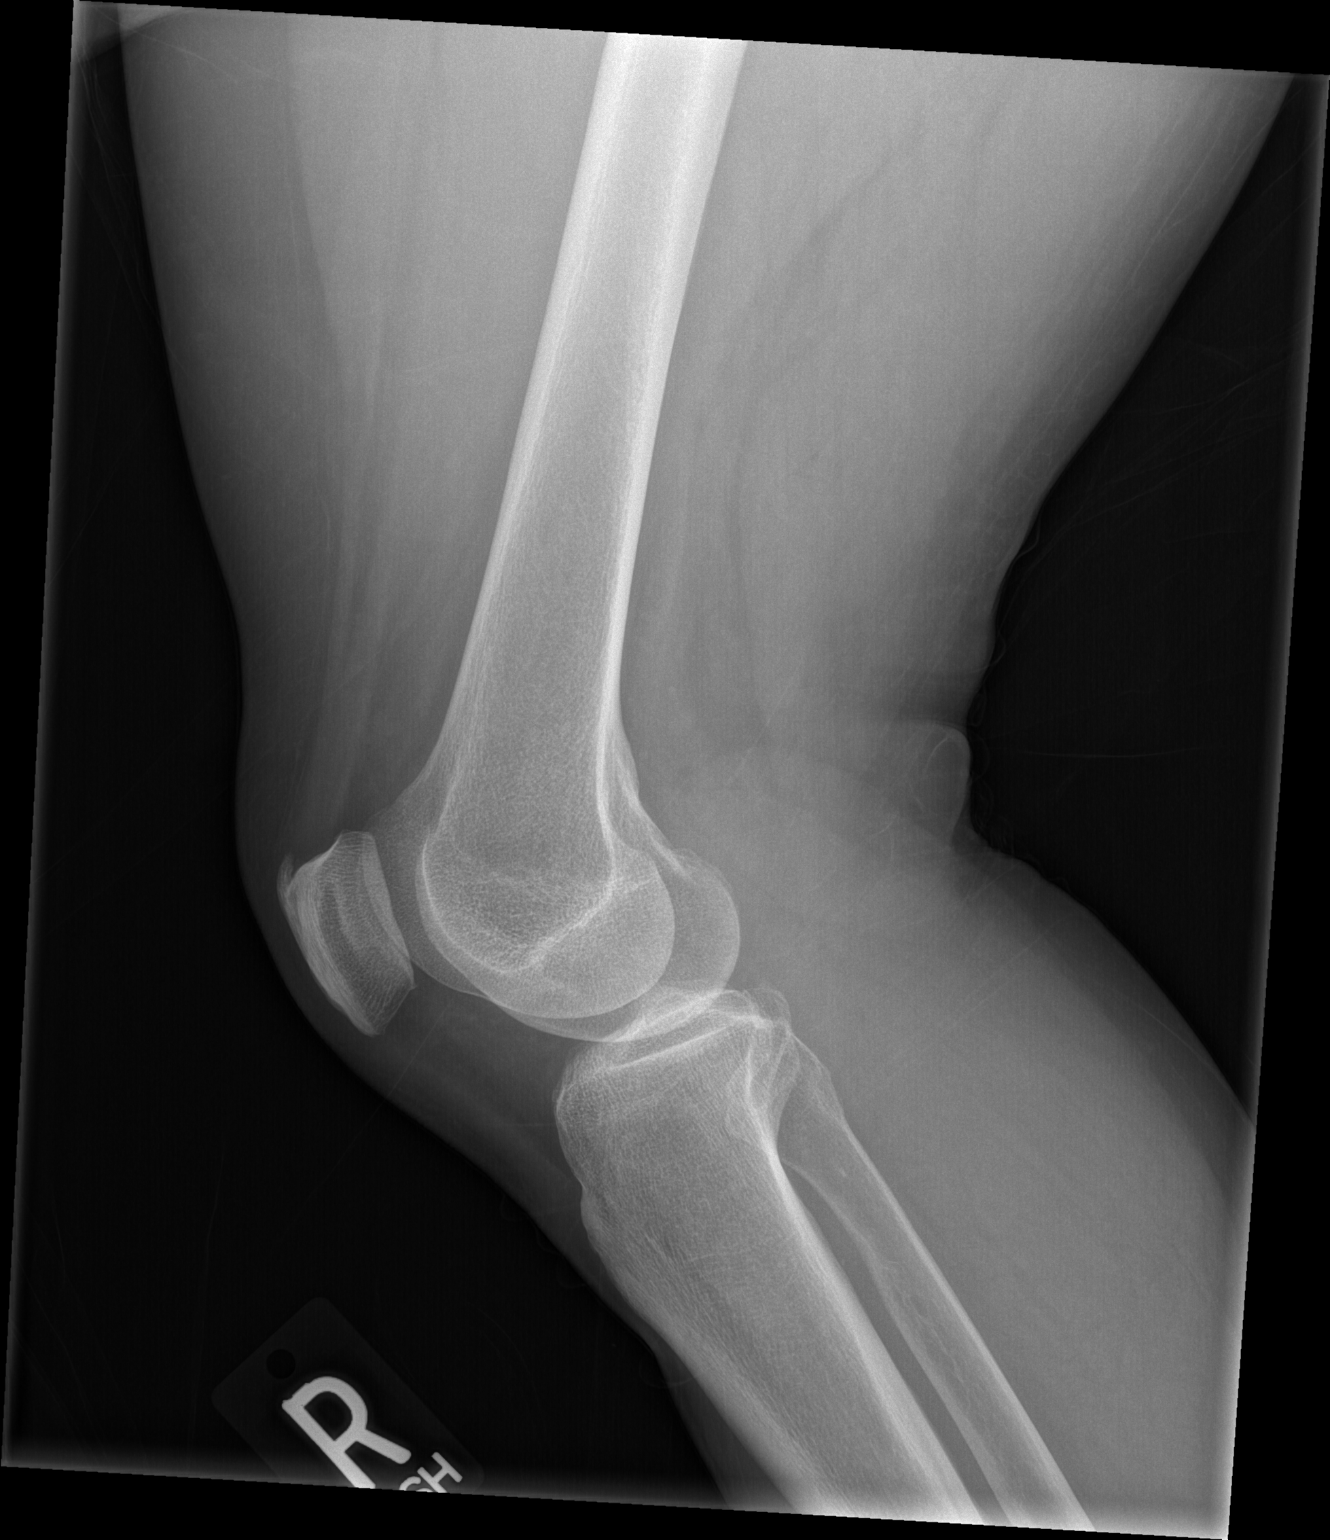

[2 of 2 positions shown; findings below may reference images not displayed]

FINDINGS: No evidence of fracture, dislocation, or joint effusion. No evidence
of arthropathy or other focal bone abnormality. Soft tissues are
unremarkable.
IMPRESSION: Negative.
